# Patient Record
Sex: Male | Born: 1960 | Race: Black or African American | Hispanic: No | Marital: Married | Smoking: Never smoker
Health system: Southern US, Community
[De-identification: ages and names within clinical notes are randomized; demographics above are authoritative.]

## PROBLEM LIST (undated history)

## (undated) DIAGNOSIS — I10 Essential (primary) hypertension: Secondary | ICD-10-CM

## (undated) DIAGNOSIS — E7800 Pure hypercholesterolemia, unspecified: Secondary | ICD-10-CM

## (undated) HISTORY — PX: ROTATOR CUFF REPAIR: SHX139

---

## 2014-04-29 ENCOUNTER — Encounter: Payer: Self-pay | Admitting: *Deleted

## 2014-04-29 ENCOUNTER — Emergency Department
Admission: EM | Admit: 2014-04-29 | Discharge: 2014-04-29 | Disposition: A | Discharge status: Home / Self Care | Payer: Federal, State, Local not specified - PPO | Source: Home / Self Care | Attending: Emergency Medicine | Admitting: Emergency Medicine

## 2014-04-29 DIAGNOSIS — M779 Enthesopathy, unspecified: Secondary | ICD-10-CM

## 2014-04-29 DIAGNOSIS — M778 Other enthesopathies, not elsewhere classified: Secondary | ICD-10-CM

## 2014-04-29 MED ORDER — DICLOFENAC SODIUM 50 MG PO TBEC: 25.0000 mg | DELAYED_RELEASE_TABLET | Freq: Two times a day (BID) | ORAL | Status: DC

## 2014-04-29 MED ORDER — DICLOFENAC SODIUM 25 MG PO TBEC: 25.0000 mg | DELAYED_RELEASE_TABLET | Freq: Two times a day (BID) | ORAL | Status: DC

## 2014-04-29 NOTE — ED Notes (Signed)
Pt c/o RT arm pain intermittently x 3 wks. Aleve helped.

## 2014-04-29 NOTE — ED Provider Notes (Signed)
CSN: [id number]     Arrival date & time 04/29/14  1841 History   First MD Initiated Contact with Patient 04/29/14 1914     Chief Complaint  Patient presents with  . Arm Pain   (Consider location/radiation/quality/duration/timing/severity/associated sxs/prior Treatment) Patient is a 54 y.o. male presenting with arm pain. The history is provided by the patient. No language interpreter was used.  Arm Pain This is a new problem. The problem occurs constantly. The problem has been gradually worsening. Nothing aggravates the symptoms. Nothing relieves the symptoms. He has tried nothing for the symptoms. The treatment provided no relief.  Pt complains of pain in right arm on and off for 3 weeks.  History reviewed. No pertinent past medical history. History reviewed. No pertinent past surgical history. Family History  Problem Relation Age of Onset  . Diabetes Mother   . Heart failure Sister    History  Substance Use Topics  . Smoking status: Never Smoker   . Smokeless tobacco: Not on file  . Alcohol Use: Yes    Review of Systems  All other systems reviewed and are negative.   Allergies  Review of patient's allergies indicates no known allergies.  Home Medications   Prior to Admission medications   Medication Sig Start Date End Date Taking? Authorizing Provider  diclofenac (VOLTAREN) 25 MG EC tablet Take 1 tablet (25 mg total) by mouth 2 (two) times daily. 04/29/14   Elson AreasLeslie K Sofia, PA-C   BP 142/93 mmHg  Pulse 62  Temp(Src) 97.9 F (36.6 C) (Oral)  Resp 18  Ht 5\' 10"  (1.778 m)  Wt 218 lb (98.884 kg)  BMI 31.28 kg/m2  SpO2 98% Physical Exam  Constitutional: He is oriented to person, place, and time. He appears well-developed and well-nourished.  Musculoskeletal: He exhibits tenderness.  Pain upper and mid forearm with range of motion,  Pain with pronation and supination  Neurological: He is alert and oriented to person, place, and time. He has normal reflexes.  Skin: Skin  is warm.  Psychiatric: He has a normal mood and affect.  Nursing note and vitals reviewed.   ED Course  Procedures (including critical care time) Labs Review Labs Reviewed - No data to display  Imaging Review No results found.   MDM   1. Forearm tendonitis    Sling voltaren See Dr. Karie Schwalbet for evaluation if pain persist past one week    Elson AreasLeslie K Sofia, [place]-C 04/29/14 1945

## 2014-04-29 NOTE — Discharge Instructions (Signed)
Calcific Tendinitis  Calcific tendinitis occurs when crystals of calcium are deposited in a tendon. Tendons are bands of strong, fibrous tissue that attach muscles to bones. Tendons are an important part of joints. They make the joint move and they absorb some of the stress that a joint receives during use. When calcium is deposited in the tendon, the tendon becomes stiff, painful, and it can become swollen. Calcific tendinitis occurs frequently in the shoulder joint, in a structure called the rotator cuff.  CAUSES   The cause of calcific tendinitis is unclear. It may be associated with:  · Overuse of the tendon, such as from repetitive motion.  · Excess stress on the tendon.  · Aging.  · Repetitive, mild injuries.  SYMPTOMS   · Pain may or may not be present. If it is present, it may occur when moving the joint.  · Tenderness when pressure is applied to the tendon.  · A snapping or popping sound when the joint moves.  · Decreased motion of the joint.  · Difficulty sleeping due to pain in the joint.  DIAGNOSIS   Your health care provider will perform a physical exam. Imaging tests may also be used to make the diagnosis. These may include X-rays, an MRI, or a CT scan.  TREATMENT   Generally, calcific tendinitis resolves on its own. Treatment for pain of calcific tendinitis may include:  · Taking over-the-counter medicines, such as anti-inflammatory drugs.  · Applying ice packs to the joint.  · Following a specific exercise program to keep the joint working properly.  · Attending physical therapy sessions.  · Avoiding activities that cause pain.  Treatment for more severe calcific tendinitis may require:  · Injecting cortisone steroids or pain relieving medicines into or around the joint.  · Manipulating the joint after you are given medicine to numb the area (local anesthetic).  · Inflating the joint with sterile fluid to increase the flexibility of the tendons.  · Shockwave therapy, which involves focusing sound  waves on the joint.  If other treatments do not work, surgery may be done to clean out the calcium deposits and repair the tendons where needed. Most people do not need surgery.  HOME CARE INSTRUCTIONS   · Only take over-the-counter or prescription medicines for pain, fever, or discomfort as directed by your health care provider.  · Follow your health care provider's recommendations for activity and exercise.  SEEK MEDICAL CARE IF:  · You notice an increase in pain or numbness.  · You develop new weakness.  · You notice increased joint stiffness or a sensation of looseness in the joint.  · You notice increasing redness, swelling, or warmth around the joint area.  SEEK IMMEDIATE MEDICAL CARE IF:  · You have a fever or persistent symptoms for more than 2 to 3 days.  · You have a fever and your symptoms suddenly get worse.  MAKE SURE YOU:  · Understand these instructions.  · Will watch your condition.  · Will get help right away if you are not doing well or get worse.  Document Released: 11/10/2007 Document Revised: 06/17/2013 Document Reviewed: 05/12/2011  [employer]® Patient Information ©2015 [employer]. This information is not intended to replace advice given to you by your health care provider. Make sure you discuss any questions you have with your health care provider.

## 2017-11-25 ENCOUNTER — Emergency Department
Admission: EM | Admit: 2017-11-25 | Discharge: 2017-11-25 | Discharge status: Absent without leave | Payer: Federal, State, Local not specified - PPO | Source: Home / Self Care

## 2019-10-09 ENCOUNTER — Emergency Department (INDEPENDENT_AMBULATORY_CARE_PROVIDER_SITE_OTHER): Payer: Federal, State, Local not specified - PPO

## 2019-10-09 ENCOUNTER — Other Ambulatory Visit: Payer: Self-pay

## 2019-10-09 ENCOUNTER — Emergency Department
Admission: EM | Admit: 2019-10-09 | Discharge: 2019-10-09 | Disposition: A | Discharge status: Home / Self Care | Payer: Federal, State, Local not specified - PPO | Source: Home / Self Care | Attending: Emergency Medicine | Admitting: Emergency Medicine

## 2019-10-09 DIAGNOSIS — S93492A Sprain of other ligament of left ankle, initial encounter: Secondary | ICD-10-CM | POA: Diagnosis not present

## 2019-10-09 DIAGNOSIS — W109XXA Fall (on) (from) unspecified stairs and steps, initial encounter: Secondary | ICD-10-CM

## 2019-10-09 DIAGNOSIS — S93432A Sprain of tibiofibular ligament of left ankle, initial encounter: Secondary | ICD-10-CM | POA: Diagnosis not present

## 2019-10-09 DIAGNOSIS — M25561 Pain in right knee: Secondary | ICD-10-CM

## 2019-10-09 DIAGNOSIS — S8001XA Contusion of right knee, initial encounter: Secondary | ICD-10-CM

## 2019-10-09 MED ORDER — IBUPROFEN 200 MG PO TABS: ORAL_TABLET | ORAL | 0 refills | Status: AC

## 2019-10-09 NOTE — Discharge Instructions (Signed)
You have a sprain left ankle and contusion right knee.  X-ray left ankle and right knee are negative for fracture. Advised wearing elastic knee sleeve right knee.-We will fit you with knee sleeve or knee brace. We are also applying left ankle brace. Ibuprofen as needed for pain. Please read attached instruction sheet on ankle sprain. Follow-up with sports medicine physician or orthopedist if not improving in 7 to 10 days, sooner if worse or new symptoms.

## 2019-10-09 NOTE — ED Provider Notes (Signed)
[facility]    CSN: [id number] Arrival date & time: 10/09/19  1056      History   Chief Complaint Chief Complaint  Patient presents with  . Ankle Injury    Left  . Knee Pain    Right    HPI Turner Baillie is a 59 y.o. male.   HPI 5 days ago, he was helping his daughter move and fell down a couple steps landing on left ankle, inversion injury left ankle and directly hit right knee.  He feels he aggravated "an old right knee injury". He was able to ambulate into our facility, but he was limping. No other musculoskeletal complaints. No fever or chills or nausea or vomiting.  Pertinent items noted in HPI and remainder of comprehensive ROS otherwise negative.  History reviewed. No pertinent past medical history.  There are no problems to display for this patient.   History reviewed. No pertinent surgical history.     Home Medications    Prior to Admission medications   Medication Sig Start Date End Date Taking? Authorizing Provider  amLODipine (NORVASC) 5 MG tablet Take 5 mg by mouth daily.   Yes [provider]  atorvastatin (LIPITOR) 20 MG tablet Take 20 mg by mouth daily.   Yes [provider]  ibuprofen (ADVIL) 200 MG tablet Take three tablets ( 600 milligrams total) every 6 with food as needed for pain. 10/09/19   Lajean Manes, MD    Family History Family History  Problem Relation Age of Onset  . Diabetes Mother   . Heart failure Sister     Social History Social History   Tobacco Use  . Smoking status: Never Smoker  . Smokeless tobacco: Never Used  Substance Use Topics  . Alcohol use: Yes    Comment: soically  . Drug use: No     Allergies   Patient has no known allergies.   Review of Systems Review of Systems   Physical Exam Triage Vital Signs ED Triage Vitals  Enc Vitals Group     BP 10/09/19 1147 (!) 147/94     Pulse Rate 10/09/19 1147 67     Resp 10/09/19 1147 17     Temp 10/09/19 1147 97.9 F (36.6 C)       Temp Source 10/09/19 1147 Oral     SpO2 10/09/19 1147 99 %     Weight --      Height --      Head Circumference --      Peak Flow --      Pain Score 10/09/19 1144 9     Pain Loc --      Pain Edu? --      Excl. in GC? --    No data found.  Updated Vital Signs BP (!) 147/94 (BP Location: Right Arm) Comment: did not take bp meds today, education provided  Pulse 67   Temp 97.9 F (36.6 C) (Oral)   Resp 17   SpO2 99%   Visual Acuity Right Eye Distance:   Left Eye Distance:   Bilateral Distance:    Right Eye Near:   Left Eye Near:    Bilateral Near:     Physical Exam Vitals reviewed.  Constitutional:      General: He is not in acute distress.    Appearance: He is well-developed.  HENT:     Head: Normocephalic and atraumatic.  Eyes:     General: No scleral icterus.    Pupils: Pupils  are equal, round, and reactive to light.  Cardiovascular:     Rate and Rhythm: Normal rate and regular rhythm.  Pulmonary:     Effort: Pulmonary effort is normal.  Abdominal:     General: There is no distension.  Musculoskeletal:     Cervical back: Normal range of motion and neck supple.     Right knee: Swelling (Minimal) and bony tenderness present. No deformity or ecchymosis. Decreased range of motion. No LCL laxity, MCL laxity or ACL laxity. Normal patellar mobility. Normal pulse.     Instability Tests: Anterior drawer test negative.     Left ankle: Swelling and ecchymosis present. No deformity or lacerations. Tenderness present over the lateral malleolus and ATF ligament. No base of 5th metatarsal or proximal fibula tenderness. Decreased range of motion. Anterior drawer test positive. Normal pulse.     Left Achilles Tendon: Normal.  Skin:    General: Skin is warm and dry.  Neurological:     Mental Status: He is alert and oriented to person, place, and time.     Cranial Nerves: No cranial nerve deficit.  Psychiatric:        Behavior: Behavior normal.      UC Treatments /  Results  Labs (all labs ordered are listed, but only abnormal results are displayed) Labs Reviewed - No data to display  EKG   Radiology DG Ankle Complete Left  Result Date: 10/09/2019 CLINICAL DATA:  Pain following fall EXAM: LEFT ANKLE COMPLETE - 3+ VIEW COMPARISON:  None. FINDINGS: Frontal, oblique, and lateral views were obtained. There is mild soft tissue swelling. There is no evident fracture or joint effusion. No joint space narrowing or erosion. Ankle mortise appears intact. IMPRESSION: No evident fracture or appreciable arthropathy. Ankle mortise appears intact. Electronically Signed   By: Bretta Bang III M.D.   On: 10/09/2019 12:12   DG Knee Complete 4 Views Right  Result Date: 10/09/2019 CLINICAL DATA:  Pain after fall. EXAM: RIGHT KNEE - COMPLETE 4+ VIEW COMPARISON:  None. FINDINGS: Tiny suprapatellar joint effusion identified. No acute fracture or dislocation identified. Mild sharpening of the tibial spines. Joint spaces are otherwise normal in appearance. IMPRESSION: 1. Tiny suprapatellar joint effusion.  No fracture identified. 2. Mild osteoarthritis. Electronically Signed   By: Signa Kell M.D.   On: 10/09/2019 12:12    Procedures Procedures (including critical care time)  Medications Ordered in UC Medications - No data to display  Initial Impression / Assessment and Plan / UC Course  I have reviewed the triage vital signs and the nursing notes.  Pertinent labs & imaging results that were available during my care of the patient were reviewed by me and considered in my medical decision making (see chart for details).     Reviewed above x-rays with patient.  Gave him printed copies of x-ray reports.  Left ankle x-ray no fracture or dislocation.  Right knee x-ray no fracture or dislocation, with tiny suprapatellar joint effusion.  Explained the treatment is conservative. Final Clinical Impressions(s) / UC Diagnoses   Final diagnoses:  Sprain of tibiofibular  ligament of left ankle, initial encounter  Contusion of right knee, initial encounter     Discharge Instructions     You have a sprain left ankle and contusion right knee.  X-ray left ankle and right knee are negative for fracture. Advised wearing elastic knee sleeve right knee.-We will fit you with knee sleeve or knee brace. We are also applying left ankle brace. Ibuprofen as needed  for pain. Please read attached instruction sheet on ankle sprain. Follow-up with sports medicine physician or orthopedist if not improving in 7 to 10 days, sooner if worse or new symptoms.     ED Prescriptions    Medication Sig Dispense Auth. Provider   ibuprofen (ADVIL) 200 MG tablet Take three tablets ( 600 milligrams total) every 6 with food as needed for pain. 30 tablet Lajean Manes, MD     PDMP not reviewed this encounter.   Lajean Manes, MD 10/09/19 1919

## 2019-10-09 NOTE — ED Triage Notes (Signed)
Patient presents to Urgent Care with complaints of left ankle pain and right knee pain since 5 days ago. Patient reports he was helping his daughter move and fell down the stairs and thinks he sprained his left ankle and aggravated an old right knee injury. Pt ambulatory upon arrival, limping.

## 2020-04-11 ENCOUNTER — Emergency Department
Admission: EM | Admit: 2020-04-11 | Discharge: 2020-04-11 | Disposition: A | Discharge status: Home / Self Care | Payer: Federal, State, Local not specified - PPO | Source: Home / Self Care

## 2020-04-11 ENCOUNTER — Other Ambulatory Visit: Payer: Self-pay

## 2020-04-11 ENCOUNTER — Emergency Department (INDEPENDENT_AMBULATORY_CARE_PROVIDER_SITE_OTHER): Payer: Federal, State, Local not specified - PPO

## 2020-04-11 DIAGNOSIS — M5459 Other low back pain: Secondary | ICD-10-CM | POA: Diagnosis not present

## 2020-04-11 DIAGNOSIS — M549 Dorsalgia, unspecified: Secondary | ICD-10-CM | POA: Diagnosis not present

## 2020-04-11 HISTORY — DX: Essential (primary) hypertension: I10

## 2020-04-11 HISTORY — DX: Pure hypercholesterolemia, unspecified: E78.00

## 2020-04-11 MED ORDER — TIZANIDINE HCL 4 MG PO TABS: 4.0000 mg | ORAL_TABLET | Freq: Four times a day (QID) | ORAL | 0 refills | Status: DC | PRN

## 2020-04-11 MED ORDER — METHYLPREDNISOLONE 4 MG PO TBPK: ORAL_TABLET | ORAL | 0 refills | Status: DC

## 2020-04-11 MED ORDER — KETOROLAC TROMETHAMINE 60 MG/2ML IM SOLN
60.0000 mg | Freq: Once | INTRAMUSCULAR | Status: AC
  Administered 2020-04-11: 60 mg via INTRAMUSCULAR

## 2020-04-11 MED ORDER — HYDROCODONE-ACETAMINOPHEN 7.5-325 MG PO TABS: 1.0000 | ORAL_TABLET | Freq: Four times a day (QID) | ORAL | 0 refills | Status: DC | PRN

## 2020-04-11 NOTE — ED Triage Notes (Signed)
Pt presents to Urgent Care with c/o lower back pain x 3-4 days. Pt reports it began as a sharp pain when he stood up from getting out of his car. Has been taking Motrin, Tylenol, and Bengay w/o significant relief.

## 2020-04-11 NOTE — Discharge Instructions (Addendum)
Take the Medrol Dosepak as directed.  This is the steroid to reduce inflammation.  Take all of day one today Take the muscle relaxer as needed.  This is useful at nighttime. Take pain medication as needed.  Do not drive on the pain medication Gentle massage to area may help May use ice or heat to area for pain relief See your doctor, or go to sports medicine if not improving in a few days.  You may need physical therapy

## 2020-04-11 NOTE — ED Provider Notes (Signed)
[facility]    CSN: [id number] Arrival date & time: 04/11/20  0800      History   Chief Complaint Chief Complaint  Patient presents with  . Back Pain    HPI Lemarcus Baggerly is a 60 y.o. male.   HPI   Pleasant 60 year old gentleman who injured his back 3 to 4 days ago.  He states that he was getting out of his car with a twisting movement as he was standing,He felt sudden severe pain in the left low back.  He has tried ice, heat, rest, topical creams, over-the-counter ibuprofen.  Nothing is helping.  The pain is made much worse by sitting, somewhat better by standing although he cannot stand fully erect.  Patient states that he is not having any numbness or weakness into his legs.  No bowel or bladder complaint.  No history of back problems.  No history of cancer Patient has well-controlled hypertension and hyperlipidemia.  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 Hypercholesteremia   . Hypertension     There are no problems to display for this patient.   Past Surgical History:  Procedure Laterality Date  . ROTATOR CUFF REPAIR Right        Home Medications    Prior to Admission medications   Medication Sig Start Date End Date Taking? Authorizing Provider  acetaminophen (TYLENOL) 325 MG tablet Take 650 mg by mouth every 6 (six) hours as needed.   Yes [provider]  diclofenac Sodium (VOLTAREN) 1 % GEL Apply 2 g topically 4 (four) times daily.   Yes [provider]  HYDROcodone-acetaminophen (NORCO) 7.5-325 MG tablet Take 1 tablet by mouth every 6 (six) hours as needed for moderate pain. 04/11/20  Yes Eustace Moore, MD  methylPREDNISolone (MEDROL DOSEPAK) 4 MG TBPK tablet tad 04/11/20  Yes Eustace Moore, MD  tiZANidine (ZANAFLEX) 4 MG tablet Take 1-2 tablets (4-8 mg total) by mouth every 6 (six) hours as needed for muscle spasms. 04/11/20  Yes Eustace Moore, MD  amLODipine (NORVASC) 5  MG tablet Take 5 mg by mouth daily.    [provider]  atorvastatin (LIPITOR) 20 MG tablet Take 20 mg by mouth daily.    [provider]  ibuprofen (ADVIL) 200 MG tablet Take three tablets ( 600 milligrams total) every 6 with food as needed for pain. 10/09/19   Lajean Manes, MD    Family History Family History  Problem Relation Age of Onset  . Diabetes Mother   . Stroke Father   . Heart failure Sister     Social History Social History   Tobacco Use  . Smoking status: Never Smoker  . Smokeless tobacco: Never Used  Vaping Use  . Vaping Use: Never used  Substance Use Topics  . Alcohol use: Yes    Alcohol/week: 2.0 - 3.0 standard drinks    Types: 2 - 3 Cans of beer per week    Comment: several times weekly  . Drug use: No     Allergies   Patient has no known allergies.   Review of Systems Review of Systems See HPI  Physical Exam Triage Vital Signs ED Triage Vitals  Enc Vitals Group     BP 04/11/20 0822 (!) 152/92     Pulse Rate 04/11/20 0822 69     Resp 04/11/20 0822 20     Temp 04/11/20 0822 98.8 F (37.1 C)  Temp Source 04/11/20 0822 Oral     SpO2 04/11/20 0822 98 %     Weight 04/11/20 0821 250 lb (113.4 kg)     Height 04/11/20 0821 5\' 10"  (1.778 m)     Head Circumference --      Peak Flow --      Pain Score 04/11/20 0820 8     Pain Loc --      Pain Edu? --      Excl. in GC? --    No data found.  Updated Vital Signs BP 138/90 (BP Location: Left Arm)   Pulse 69   Temp 98.8 F (37.1 C) (Oral)   Resp 20   Ht 5\' 10"  (1.778 m)   Wt 113.4 kg   SpO2 98%   BMI 35.87 kg/m      Physical Exam Constitutional:      General: He is not in acute distress.    Appearance: He is well-developed and well-nourished.     Comments: Mask is in place.  Patient is acutely uncomfortable pacing about room.  Stands in a slightly flexed position  HENT:     Head: Normocephalic and atraumatic.     Mouth/Throat:     Mouth: Oropharynx is clear and  moist.  Eyes:     Conjunctiva/sclera: Conjunctivae normal.     Pupils: Pupils are equal, round, and reactive to light.  Cardiovascular:     Rate and Rhythm: Normal rate.  Pulmonary:     Effort: Pulmonary effort is normal. No respiratory distress.  Abdominal:     Palpations: Abdomen is soft.  Musculoskeletal:        General: No edema. Normal range of motion.     Cervical back: Normal range of motion.       Back:  Skin:    General: Skin is warm and dry.  Neurological:     General: No focal deficit present.     Mental Status: He is alert.     Sensory: No sensory deficit.     Motor: No weakness.     Coordination: Coordination normal.     Gait: Gait abnormal.     Deep Tendon Reflexes: Reflexes normal.  Psychiatric:        Behavior: Behavior normal.      UC Treatments / Results  Labs (all labs ordered are listed, but only abnormal results are displayed) Labs Reviewed - No data to display  EKG   Radiology DG Lumbar Spine Complete  Result Date: 04/11/2020 CLINICAL DATA:  Acute left lower back pain 3 days.  No injury. EXAM: LUMBAR SPINE - COMPLETE 4+ VIEW COMPARISON:  None. FINDINGS: Vertebral body alignment is normal. Subtle loss of mid vertebral body height of L2 likely chronic. Subtle disc space narrowing at the L2-3 level. Mild spondylosis throughout the lumbar spine to include facet arthropathy. Remainder of the exam is unremarkable. IMPRESSION: 1. No acute findings. 2. Mild spondylosis of the lumbar spine with mild disc disease at the L2-3 level. Electronically Signed   By: M.D.   On: 04/11/2020 09:26    Procedures Procedures (including critical care time)  Medications Ordered in UC Medications  ketorolac (TORADOL) injection 60 mg (60 mg Intramuscular Given 04/11/20 0956)    Initial Impression / Assessment and Plan / UC Course  I have reviewed the triage vital signs and the nursing notes.  Pertinent labs & imaging results that were available during my  care of the patient were reviewed by me and  considered in my medical decision making (see chart for details).     Acute lumbar strain.  Appears to be musculoskeletal.  Discussed conservative treatment.  Activity within pain limits.  Ice or heat.  Prednisone Dosepak as prescribed as well as muscle relaxer and pain medication.  May switch to ibuprofen once prednisone is finished.  See personal physician if not improving.  No sign of nerve impingement. Final Clinical Impressions(s) / UC Diagnoses   Final diagnoses:  Musculoskeletal back pain     Discharge Instructions     Take the Medrol Dosepak as directed.  This is the steroid to reduce inflammation.  Take all of day one today Take the muscle relaxer as needed.  This is useful at nighttime. Take pain medication as needed.  Do not drive on the pain medication Gentle massage to area may help May use ice or heat to area for pain relief See your doctor, or go to sports medicine if not improving in a few days.  You may need physical therapy    ED Prescriptions    Medication Sig Dispense Auth. Provider   tiZANidine (ZANAFLEX) 4 MG tablet Take 1-2 tablets (4-8 mg total) by mouth every 6 (six) hours as needed for muscle spasms. 21 tablet Eustace Moore, MD   HYDROcodone-acetaminophen [facility]) 7.5-325 MG tablet Take 1 tablet by mouth every 6 (six) hours as needed for moderate pain. 15 tablet Eustace Moore, MD   methylPREDNISolone (MEDROL DOSEPAK) 4 MG TBPK tablet tad 21 tablet Eustace Moore, MD     I have reviewed the PDMP during this encounter.   Eustace Moore, MD 04/11/20 1003

## 2020-12-12 ENCOUNTER — Other Ambulatory Visit: Payer: Self-pay

## 2020-12-12 ENCOUNTER — Emergency Department
Admission: EM | Admit: 2020-12-12 | Discharge: 2020-12-12 | Disposition: A | Discharge status: Home / Self Care | Payer: Federal, State, Local not specified - PPO | Source: Home / Self Care | Attending: Family Medicine | Admitting: Family Medicine

## 2020-12-12 ENCOUNTER — Encounter: Payer: Self-pay | Admitting: Emergency Medicine

## 2020-12-12 DIAGNOSIS — J069 Acute upper respiratory infection, unspecified: Secondary | ICD-10-CM | POA: Diagnosis not present

## 2020-12-12 DIAGNOSIS — J029 Acute pharyngitis, unspecified: Secondary | ICD-10-CM | POA: Diagnosis not present

## 2020-12-12 DIAGNOSIS — J039 Acute tonsillitis, unspecified: Secondary | ICD-10-CM | POA: Diagnosis not present

## 2020-12-12 LAB — POCT RAPID STREP A (OFFICE): Rapid Strep A Screen: NEGATIVE

## 2020-12-12 MED ORDER — AMOXICILLIN-POT CLAVULANATE 875-125 MG PO TABS: 1.0000 | ORAL_TABLET | Freq: Two times a day (BID) | ORAL | 0 refills | Status: AC

## 2020-12-12 NOTE — ED Triage Notes (Signed)
Patient c/o sore throat, nasal drainage, productive cough x 3-4 days, low grade fever.  Patient has taken OTC cough meds.  Patient is vaccinated for COVID.

## 2020-12-12 NOTE — Discharge Instructions (Signed)
The strep test is negative Going to treat you with Augmentin for the tonsillitis.  Take twice a day Drink lots of fluids May try Chloraseptic spray or lozenges for pain, salt water gargles, Tylenol or ibuprofen as needed Call your doctor if not improving by next week

## 2020-12-12 NOTE — ED Provider Notes (Signed)
[facility]    CSN: [id number] Arrival date & time: 12/12/20  7106      History   Chief Complaint Chief Complaint  Patient presents with   Sore Throat    HPI Brandon Branch is a 60 y.o. male.   HPI  Patient states that his family is passing around a respiratory infection.  Her grandchild had at first, then his wife, now him.  He states that after 4 to 5 days of illness he feels like he is getting worse.  His sore throat is very painful.  He also has some nasal congestion and cough.  He feels somewhat tired.  No headache or body aches.  No sweats chills or fever.  He does not think it feels like a flu or COVID, but is worried about his large red tonsils.  Past Medical History:  Diagnosis Date   Hypercholesteremia    Hypertension     There are no problems to display for this patient.   Past Surgical History:  Procedure Laterality Date   ROTATOR CUFF REPAIR Right        Home Medications    Prior to Admission medications   Medication Sig Start Date End Date Taking? Authorizing Provider  amLODipine (NORVASC) 5 MG tablet Take 5 mg by mouth daily.   Yes [provider]  amoxicillin-clavulanate (AUGMENTIN) 875-125 MG tablet Take 1 tablet by mouth every 12 (twelve) hours. 12/12/20  Yes Eustace Moore, MD  atorvastatin (LIPITOR) 20 MG tablet Take 20 mg by mouth daily.   Yes [provider]  ibuprofen (ADVIL) 200 MG tablet Take three tablets ( 600 milligrams total) every 6 with food as needed for pain. 10/09/19  Yes Lajean Manes, MD    Family History Family History  Problem Relation Age of Onset   Diabetes Mother    Stroke Father    Heart failure Sister     Social History Social History   Tobacco Use   Smoking status: Never   Smokeless tobacco: Never  Vaping Use   Vaping Use: Never used  Substance Use Topics   Alcohol use: Yes    Alcohol/week: 2.0 - 3.0 standard drinks    Types: 2 - 3 Cans of beer per week    Comment: several  times weekly   Drug use: No     Allergies   Patient has no known allergies.   Review of Systems Review of Systems  See HPI Physical Exam Triage Vital Signs ED Triage Vitals  Enc Vitals Group     BP 12/12/20 0953 (!) 146/90     Pulse Rate 12/12/20 0953 75     Resp 12/12/20 0953 18     Temp 12/12/20 0953 99.3 F (37.4 C)     Temp Source 12/12/20 0953 Oral     SpO2 12/12/20 0953 97 %     Weight 12/12/20 0955 220 lb (99.8 kg)     Height 12/12/20 0955 5\' 9"  (1.753 m)     Head Circumference --      Peak Flow --      Pain Score 12/12/20 0955 0     Pain Loc --      Pain Edu? --      Excl. in GC? --    No data found.  Updated Vital Signs BP (!) 146/90 (BP Location: Right Arm)   Pulse 75   Temp 99.3 F (37.4 C) (Oral)   Resp 18   Ht 5\' 9"  (1.753 m)  Wt 99.8 kg   SpO2 97%   BMI 32.49 kg/m     Physical Exam Constitutional:      General: He is not in acute distress.    Appearance: He is well-developed and normal weight.  HENT:     Head: Normocephalic and atraumatic.     Right Ear: Tympanic membrane, ear canal and external ear normal.     Left Ear: Tympanic membrane, ear canal and external ear normal.     Ears:     Comments: Partial cerumen both ears    Nose: Congestion present. No rhinorrhea.     Mouth/Throat:     Mouth: Mucous membranes are moist.     Pharynx: Posterior oropharyngeal erythema present.     Comments: Tonsils are large.  Erythematous.  3+.  Scant exudate. Eyes:     Conjunctiva/sclera: Conjunctivae normal.     Pupils: Pupils are equal, round, and reactive to light.  Cardiovascular:     Rate and Rhythm: Normal rate and regular rhythm.     Heart sounds: Normal heart sounds.  Pulmonary:     Effort: Pulmonary effort is normal. No respiratory distress.     Breath sounds: Normal breath sounds. No wheezing or rales.  Abdominal:     General: There is no distension.     Palpations: Abdomen is soft.  Musculoskeletal:        General: Normal range of  motion.     Cervical back: Normal range of motion.  Lymphadenopathy:     Cervical: Cervical adenopathy present.  Skin:    General: Skin is warm and dry.  Neurological:     Mental Status: He is alert.  Psychiatric:        Mood and Affect: Mood normal.        Behavior: Behavior normal.     UC Treatments / Results  Labs (all labs ordered are listed, but only abnormal results are displayed) Labs Reviewed  CULTURE, GROUP A STREP  POCT RAPID STREP A (OFFICE)    EKG   Radiology No results found.  Procedures Procedures (including critical care time)  Medications Ordered in UC Medications - No data to display  Initial Impression / Assessment and Plan / UC Course  I have reviewed the triage vital signs and the nursing notes.  Pertinent labs & imaging results that were available during my care of the patient were reviewed by me and considered in my medical decision making (see chart for details).     Patient has a sore throat, present longer than he thinks it should.  We will treat with antibiotics.  He has tried conservative care.  Throat culture pending Final Clinical Impressions(s) / UC Diagnoses   Final diagnoses:  Sore throat  Tonsillitis  Viral upper respiratory tract infection     Discharge Instructions      The strep test is negative Going to treat you with Augmentin for the tonsillitis.  Take twice a day Drink lots of fluids May try Chloraseptic spray or lozenges for pain, salt water gargles, Tylenol or ibuprofen as needed Call your doctor if not improving by next week      ED Prescriptions     Medication Sig Dispense Auth. Provider   amoxicillin-clavulanate (AUGMENTIN) 875-125 MG tablet Take 1 tablet by mouth every 12 (twelve) hours. 14 tablet Eustace Moore, MD      PDMP not reviewed this encounter.   Eustace Moore, MD 12/13/20 [phone]

## 2020-12-15 LAB — CULTURE, GROUP A STREP

## 2020-12-31 IMAGING — DX DG KNEE COMPLETE 4+V*R*
4 series · 4 of 4 positions shown · non-contrast
Comparison: None.

CLINICAL DATA: Pain after fall.

EXAM:
RIGHT KNEE - COMPLETE 4+ VIEW

[knee ap]
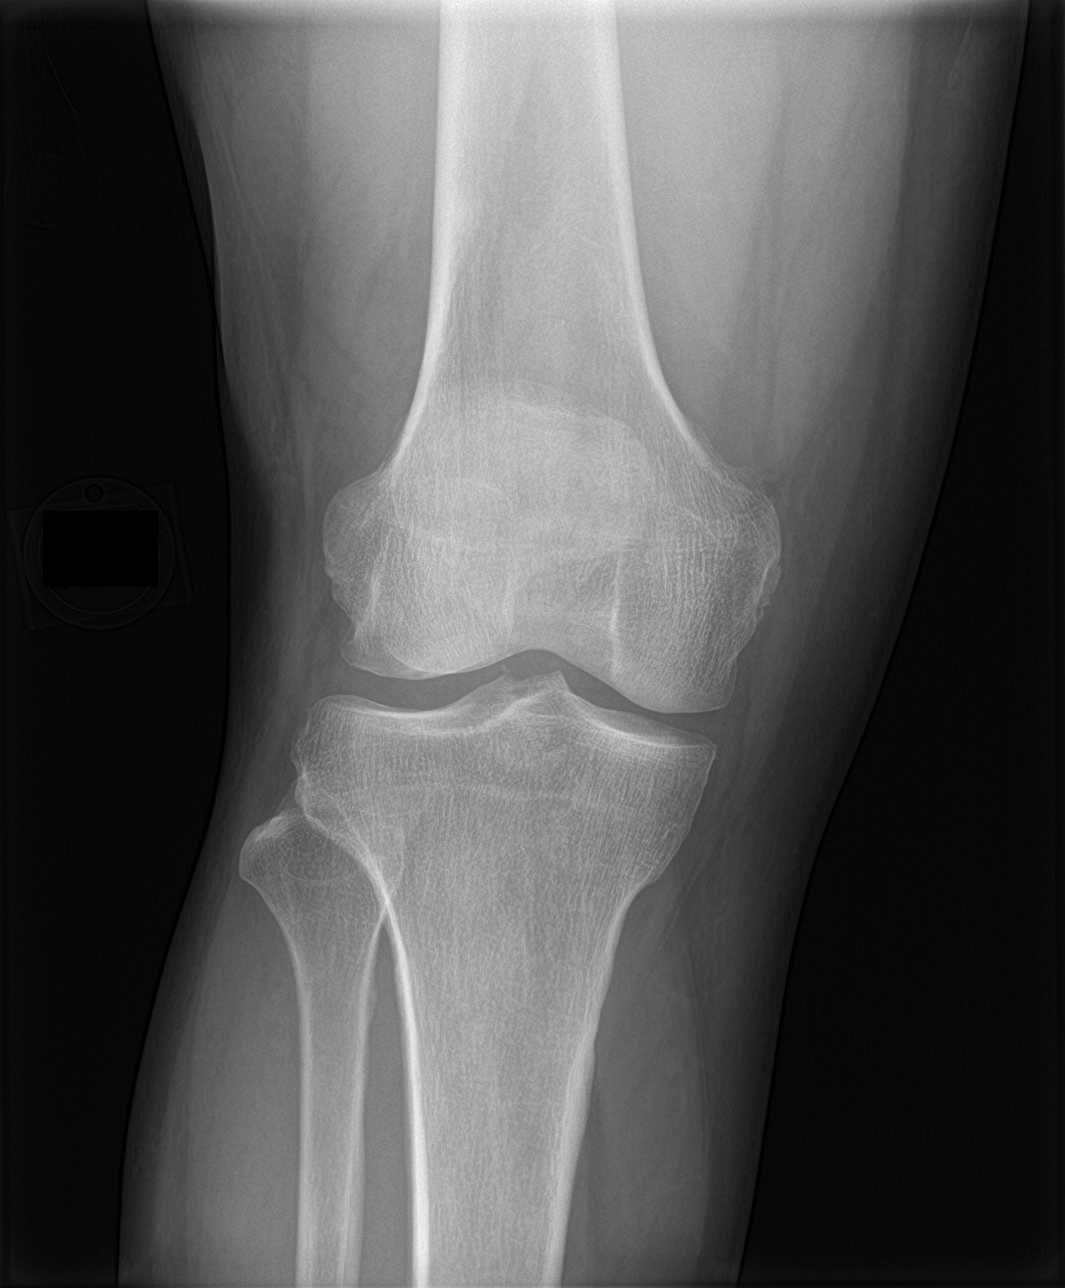

[knee lat]
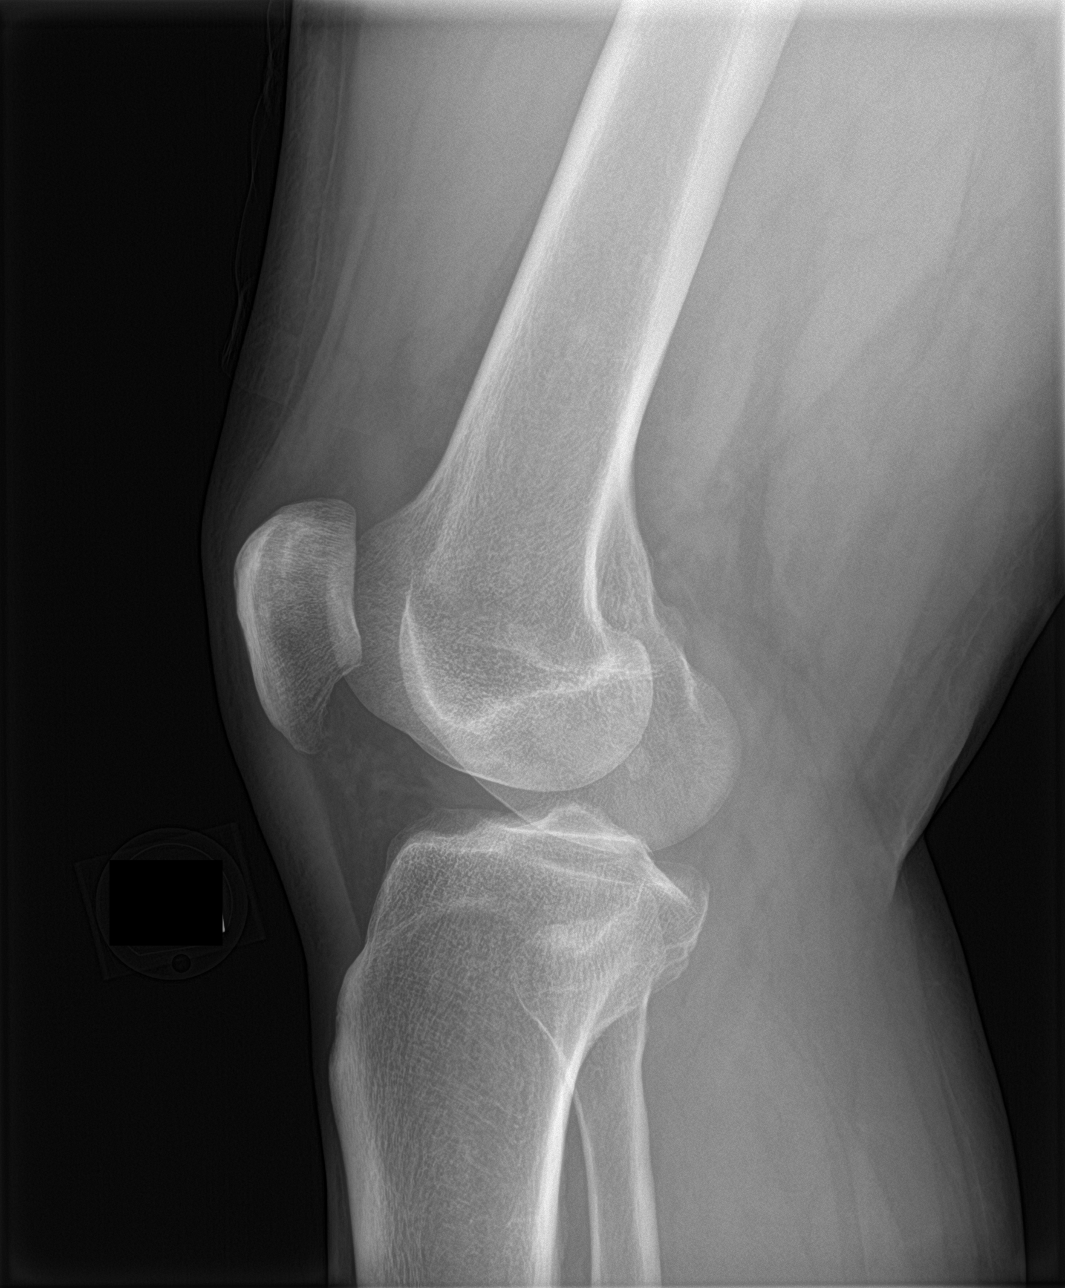

[knee obl (1 of 2)]
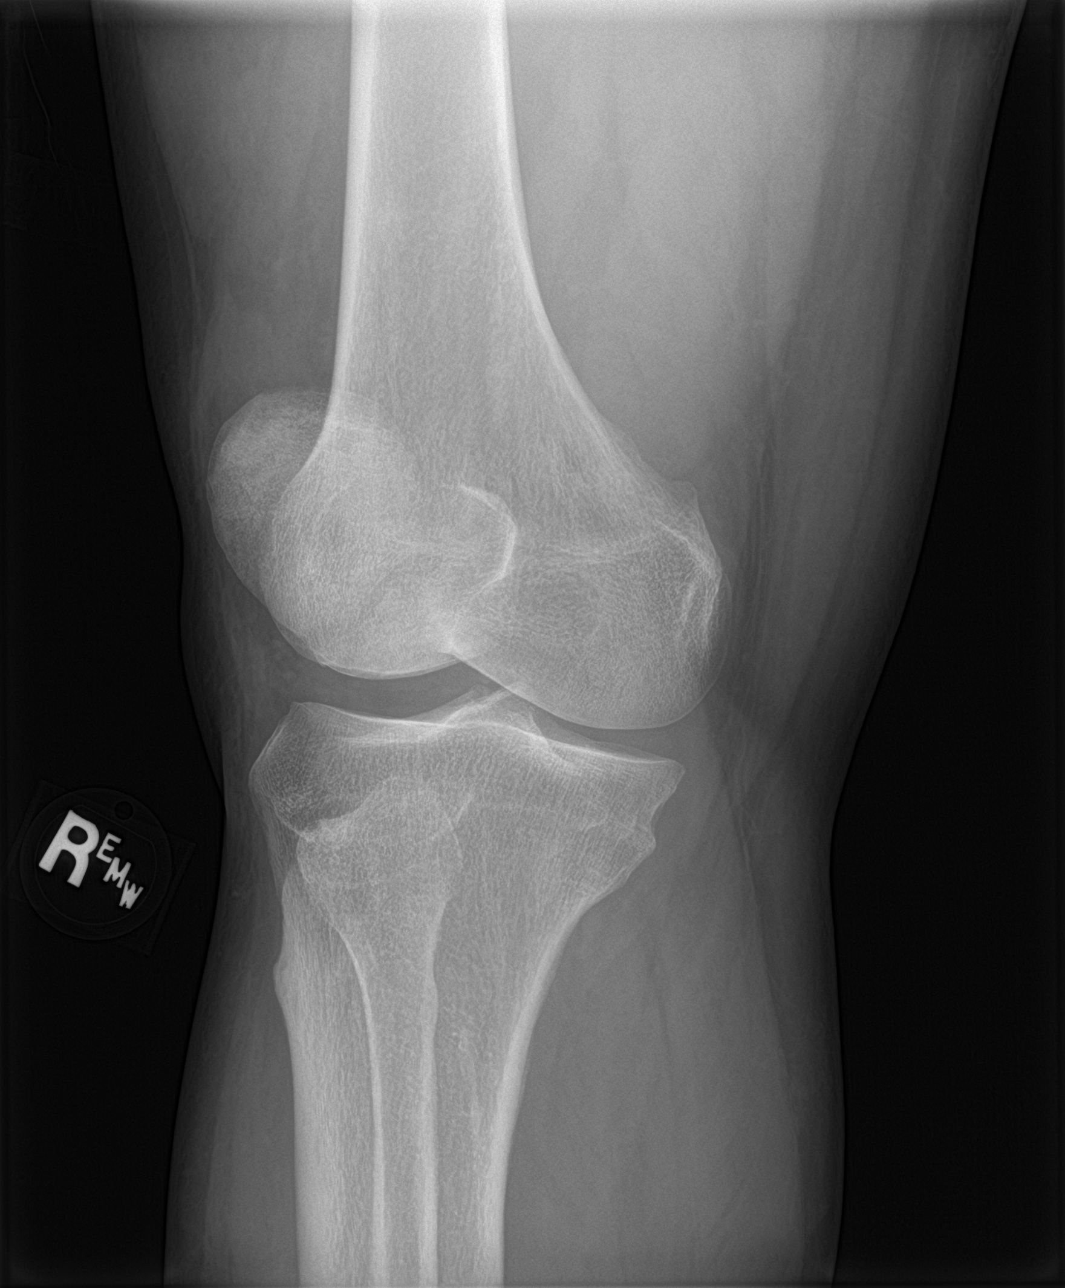

[knee obl (2 of 2)]
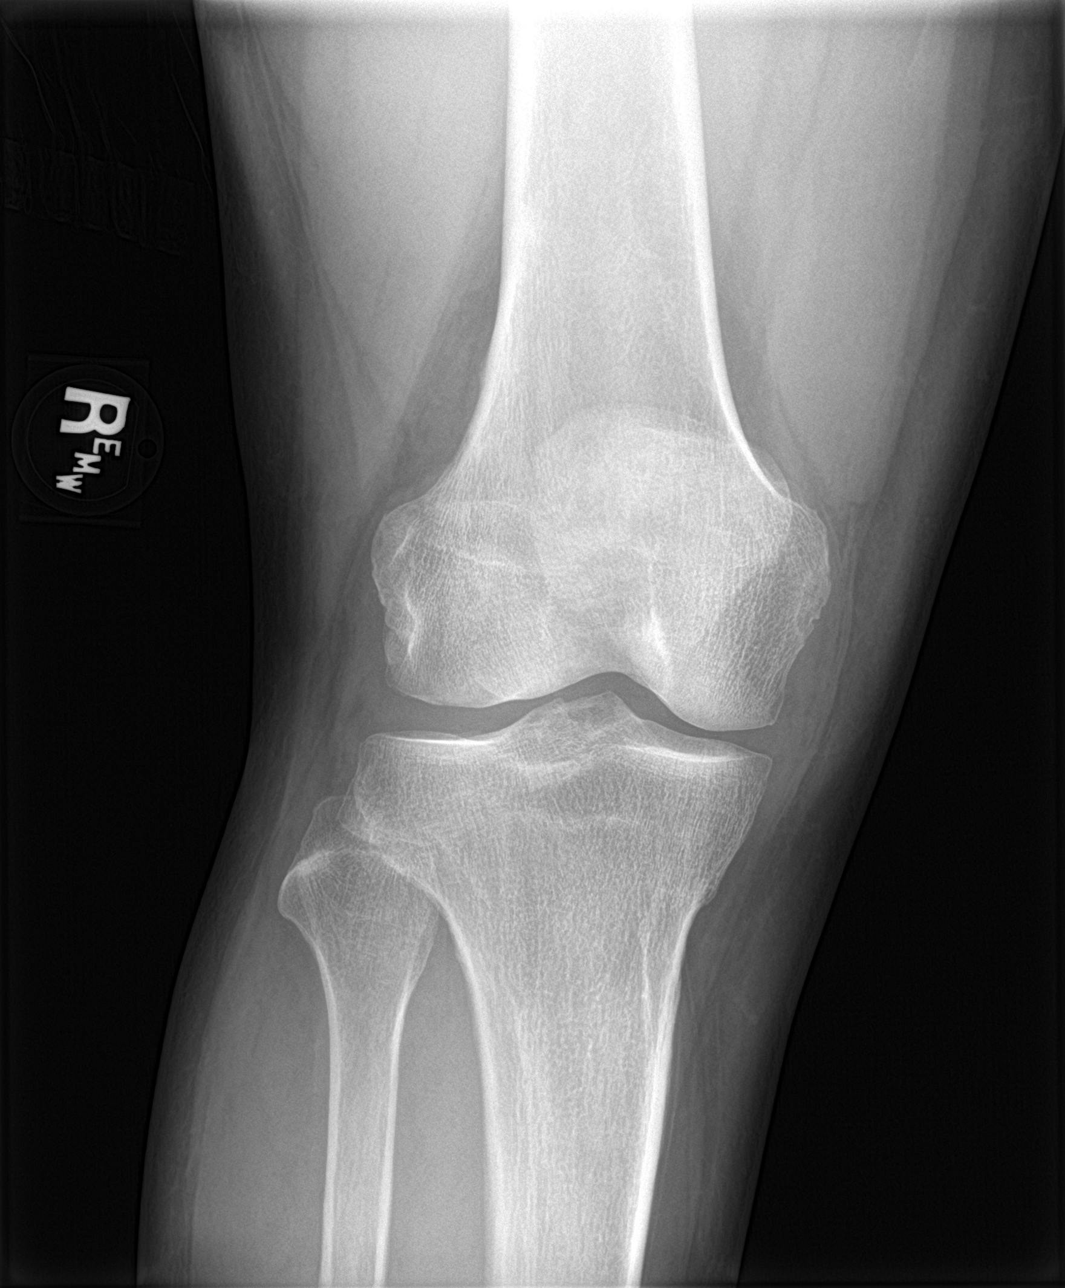

[4 of 4 positions shown; findings below may reference images not displayed]

FINDINGS: Tiny suprapatellar joint effusion identified. No acute fracture or
dislocation identified. Mild sharpening of the tibial spines. Joint
spaces are otherwise normal in appearance.
IMPRESSION: 1. Tiny suprapatellar joint effusion.  No fracture identified.
2. Mild osteoarthritis.
